# Patient Record
Sex: Male | Born: 1990 | Race: White | Hispanic: No | Marital: Single | Smoking: Current every day smoker
Health system: Southern US, Community
[De-identification: ages and names within clinical notes are randomized; demographics above are authoritative.]

## PROBLEM LIST (undated history)

## (undated) DIAGNOSIS — R011 Cardiac murmur, unspecified: Secondary | ICD-10-CM

## (undated) DIAGNOSIS — N289 Disorder of kidney and ureter, unspecified: Secondary | ICD-10-CM

---

## 2012-07-29 ENCOUNTER — Emergency Department (HOSPITAL_COMMUNITY): Payer: Self-pay

## 2012-07-29 ENCOUNTER — Emergency Department (HOSPITAL_COMMUNITY)
Admission: EM | Admit: 2012-07-29 | Discharge: 2012-07-29 | Disposition: A | Discharge status: Home / Self Care | Payer: Self-pay | Attending: Emergency Medicine | Admitting: Emergency Medicine

## 2012-07-29 ENCOUNTER — Encounter (HOSPITAL_COMMUNITY): Payer: Self-pay | Admitting: Nurse Practitioner

## 2012-07-29 DIAGNOSIS — Y9389 Activity, other specified: Secondary | ICD-10-CM | POA: Insufficient documentation

## 2012-07-29 DIAGNOSIS — R011 Cardiac murmur, unspecified: Secondary | ICD-10-CM | POA: Insufficient documentation

## 2012-07-29 DIAGNOSIS — W108XXA Fall (on) (from) other stairs and steps, initial encounter: Secondary | ICD-10-CM | POA: Insufficient documentation

## 2012-07-29 DIAGNOSIS — Y9289 Other specified places as the place of occurrence of the external cause: Secondary | ICD-10-CM | POA: Insufficient documentation

## 2012-07-29 DIAGNOSIS — R209 Unspecified disturbances of skin sensation: Secondary | ICD-10-CM | POA: Insufficient documentation

## 2012-07-29 DIAGNOSIS — S60229A Contusion of unspecified hand, initial encounter: Secondary | ICD-10-CM | POA: Insufficient documentation

## 2012-07-29 DIAGNOSIS — S60221A Contusion of right hand, initial encounter: Secondary | ICD-10-CM

## 2012-07-29 DIAGNOSIS — S20221A Contusion of right back wall of thorax, initial encounter: Secondary | ICD-10-CM

## 2012-07-29 DIAGNOSIS — S20229A Contusion of unspecified back wall of thorax, initial encounter: Secondary | ICD-10-CM | POA: Insufficient documentation

## 2012-07-29 DIAGNOSIS — W1809XA Striking against other object with subsequent fall, initial encounter: Secondary | ICD-10-CM | POA: Insufficient documentation

## 2012-07-29 DIAGNOSIS — F17200 Nicotine dependence, unspecified, uncomplicated: Secondary | ICD-10-CM | POA: Insufficient documentation

## 2012-07-29 HISTORY — DX: Cardiac murmur, unspecified: R01.1

## 2012-07-29 MED ORDER — HYDROCODONE-ACETAMINOPHEN 5-325 MG PO TABS: 1.0000 | ORAL_TABLET | ORAL | Status: DC | PRN

## 2012-07-29 MED ORDER — HYDROCODONE-ACETAMINOPHEN 5-325 MG PO TABS
1.0000 | ORAL_TABLET | Freq: Once | ORAL | Status: AC
  Administered 2012-07-29: 1 via ORAL
  Filled 2012-07-29: qty 1

## 2012-07-29 MED ORDER — DIAZEPAM 5 MG PO TABS: 5.0000 mg | ORAL_TABLET | Freq: Two times a day (BID) | ORAL | Status: DC

## 2012-07-29 NOTE — ED Notes (Signed)
PA at bedside.

## 2012-07-29 NOTE — ED Provider Notes (Signed)
CSN: [id number]     Arrival date & time 07/29/12  1832 History     First MD Initiated Contact with Patient 07/29/12 2007     Chief Complaint  Patient presents with  . Fall   (Consider location/radiation/quality/duration/timing/severity/associated sxs/prior Treatment) HPI History provided by pt.   Pt was helping a friend move an entertainment stand off of a moving truck 2 days ago, missed the ramp with foot and fell backwards, from ~26ft high, landing on his back.  Dorsal surface of right hand hit the pavement as well.  Did not hit head.  C/o pain in right medial back that is aggravated by coughing and movement, as well as pain in right lateral hand and pinky w/ associated numbness.  Denies neck pain, SOB, extremity weakness, loss of control of bladder/bowels.  Has not taken anything for pain.  No PMH.   Past Medical History  Diagnosis Date  . Heart murmur    History reviewed. No pertinent past surgical history. History reviewed. No pertinent family history. History  Substance Use Topics  . Smoking status: Current Every Day Smoker    Types: Cigarettes  . Smokeless tobacco: Not on file  . Alcohol Use: No    Review of Systems  All other systems reviewed and are negative.    Allergies  Ciprofloxacin and Ibuprofen  Home Medications  No current outpatient prescriptions on file. BP 126/50  Pulse 84  Temp(Src) 98 F (36.7 C) (Oral)  Resp 18  Ht 5\' 11"  (1.803 m)  Wt 160 lb (72.576 kg)  BMI 22.33 kg/m2  SpO2 100% Physical Exam  Nursing note and vitals reviewed. Constitutional: He is oriented to person, place, and time. He appears well-developed and well-nourished. No distress.  HENT:  Head: Normocephalic and atraumatic.  Eyes:  Normal appearance  Neck: Normal range of motion.  Cardiovascular: Normal rate and regular rhythm.   Pulmonary/Chest: Effort normal and breath sounds normal. No respiratory distress.  Pt does not complain of pain w/ deep inspirations   Musculoskeletal: Normal range of motion.  Entire spine non-tender.  Tenderness right thoracic soft tissues only.  No ecchymosis or abrasions.  Right hand w/out deformity, ecchymosis or obvious edema.  Tenderness 4th and 5th metacarpals and diffuse right 5th digit.  Pain w/ passive ROM of 4th and 5th digits.  No pain w/ ROM of wrist.  Decreased sensation to light touch on dorsal surface of 4th and 5th metacarpals.  Brisk cap refill and distal sensation intact.   Neurological: He is alert and oriented to person, place, and time.  Skin: Skin is warm and dry. No rash noted.  Psychiatric: He has a normal mood and affect. His behavior is normal.    ED Course   Procedures (including critical care time)  Labs Reviewed - No data to display Dg Ribs Unilateral W/chest Right  07/29/2012   *RADIOLOGY REPORT*  Clinical Data: Fall.  Pleuritic right-sided chest pain.  RIGHT RIBS AND CHEST - 3+ VIEW  Comparison: None.  Findings: No pneumothorax.  Frontal view the chest appears within normal limits.  There is no pleural fluid.  There are no displaced rib fractures identified.  IMPRESSION: No displaced rib fracture or acute osseous abnormality.   Original Report Authenticated By: Andreas Newport, M.D.   Dg Hand Complete Right  07/29/2012   *RADIOLOGY REPORT*  Clinical Data: Pain secondary to a fall 2 days ago.  RIGHT HAND - COMPLETE 3+ VIEW  Comparison: None.  Findings: There is no fracture or dislocation  or other osseous abnormality.  Soft tissue swelling over the dorsal aspect of the fifth metacarpal phalangeal joint.  IMPRESSION: No acute osseous abnormality.   Original Report Authenticated By: Francene Boyers, M.D.   1. Contusion of back, right, initial encounter   2. Contusion of hand, right, initial encounter     MDM  Healthy 21yo M had a mechanical fall from ~30ft 2 days ago and landed on his back.  Presents w/ right mid-back pain and right lateral hand and pinky pain.  Low suspicion for rib fracture and/or  pneumothorax.  Explained to patient that xray will not change treatment plan and fx may not be evident at this time, but he requests imaging.  Xray of right hand negative.  There is decreased sensation to light touch dorsal surface 4th and 5th metacarpals, likely from mild edema, but I will refer to Hand.  8:50 PM   No obvious rib fx.  No pain w/ deep inspiration so did not order incentive spirometer.  Prescribed 12 vicodin and recommended NSAID, ice and rest.  Return precautions discussed.   Otilio Miu, PA-C 07/29/12 2316

## 2012-07-29 NOTE — ED Notes (Signed)
Pt was moving an entertainment stand 2 days ago, he fell off ramp landing on back and entertainment stand landed on him. C/o mid back and R 5th knuckle pain since. Ambulatory, mae, cms intact

## 2012-07-29 NOTE — ED Notes (Signed)
Patient transported to X-ray 

## 2012-07-29 NOTE — ED Notes (Signed)
Patient returned from X-ray 

## 2012-07-30 NOTE — ED Provider Notes (Signed)
Medical screening examination/treatment/procedure(s) were performed by non-physician practitioner and as supervising physician I was immediately available for consultation/collaboration.  Breianna Delfino, MD 07/30/12 1716 

## 2012-08-05 ENCOUNTER — Encounter (HOSPITAL_COMMUNITY): Payer: Self-pay

## 2012-08-05 ENCOUNTER — Emergency Department (HOSPITAL_COMMUNITY)
Admission: EM | Admit: 2012-08-05 | Discharge: 2012-08-05 | Disposition: A | Discharge status: Home / Self Care | Payer: Self-pay | Attending: Emergency Medicine | Admitting: Emergency Medicine

## 2012-08-05 ENCOUNTER — Emergency Department (HOSPITAL_COMMUNITY): Payer: Self-pay

## 2012-08-05 DIAGNOSIS — W208XXA Other cause of strike by thrown, projected or falling object, initial encounter: Secondary | ICD-10-CM | POA: Insufficient documentation

## 2012-08-05 DIAGNOSIS — Y929 Unspecified place or not applicable: Secondary | ICD-10-CM | POA: Insufficient documentation

## 2012-08-05 DIAGNOSIS — R011 Cardiac murmur, unspecified: Secondary | ICD-10-CM | POA: Insufficient documentation

## 2012-08-05 DIAGNOSIS — Y9389 Activity, other specified: Secondary | ICD-10-CM | POA: Insufficient documentation

## 2012-08-05 DIAGNOSIS — F17200 Nicotine dependence, unspecified, uncomplicated: Secondary | ICD-10-CM | POA: Insufficient documentation

## 2012-08-05 DIAGNOSIS — S60229A Contusion of unspecified hand, initial encounter: Secondary | ICD-10-CM | POA: Insufficient documentation

## 2012-08-05 DIAGNOSIS — T148XXA Other injury of unspecified body region, initial encounter: Secondary | ICD-10-CM

## 2012-08-05 MED ORDER — ACETAMINOPHEN 325 MG PO TABS
975.0000 mg | ORAL_TABLET | Freq: Once | ORAL | Status: AC
  Administered 2012-08-05: 975 mg via ORAL
  Filled 2012-08-05: qty 2

## 2012-08-05 NOTE — ED Notes (Signed)
To x-ray

## 2012-08-05 NOTE — ED Provider Notes (Signed)
CSN: [id number]     Arrival date & time 08/05/12  1638 History  This chart was scribed for non-physician practitioner Arthor Captain, PA-C, working with [name], by Yevette Edwards, ED Scribe. This patient was seen in room TR08C/TR08C and the patient's care was started at 5:18 PM.   First MD Initiated Contact with Patient 08/05/12 1643     Chief Complaint  Patient presents with  . Hand Pain    The history is provided by the patient. No language interpreter was used.   HPI Comments: Jerry Yang is a 22 y.o. male who presents to the Emergency Department complaining of pain to his right hand which occurred today at work. The pt had a bundle of shingles dropped upon his hand today, and he reports that it was to the same area for which he sought treatment at this ED seven days ago. With that visit, the pt was treated and discharged with Vicodin. He reports that his Vicodin has since run out.   Past Medical History  Diagnosis Date  . Heart murmur    History reviewed. No pertinent past surgical history. History reviewed. No pertinent family history. History  Substance Use Topics  . Smoking status: Current Every Day Smoker    Types: Cigarettes  . Smokeless tobacco: Not on file  . Alcohol Use: No    Review of Systems  Constitutional: Negative for fever and chills.  Musculoskeletal: Positive for myalgias (Right hand pain).    Allergies  Ciprofloxacin and Ibuprofen  Home Medications   Current Outpatient Rx  Name  Route  Sig  Dispense  Refill  . HYDROcodone-acetaminophen (NORCO/VICODIN) 5-325 MG per tablet   Oral   Take 1 tablet by mouth every 4 (four) hours as needed for pain.   12 tablet   0    Triage Vitals: BP 130/48  Pulse 88  Temp(Src) 98.3 F (36.8 C) (Oral)  Resp 16  Ht 5\' 11"  (1.803 m)  Wt 160 lb (72.576 kg)  BMI 22.33 kg/m2  SpO2 100%  Physical Exam  Nursing note and vitals reviewed. Constitutional: He is oriented to person, place, and time. He  appears well-developed and well-nourished. No distress.  HENT:  Head: Normocephalic and atraumatic.  Eyes: EOM are normal.  Neck: Neck supple. No tracheal deviation present.  Cardiovascular: Normal rate.   Pulmonary/Chest: Effort normal. No respiratory distress.  Musculoskeletal: Normal range of motion.  Pain out of proportion to exam. No swelling. No bruising. No deformity. He states that he cannot move his fingers. He has exquisite tenderness to palpation of the MCP of the right 4th digit. Pulses intact. Cap refill less than 3 seconds.   Neurological: He is alert and oriented to person, place, and time.  Skin: Skin is warm and dry.  Psychiatric: He has a normal mood and affect. His behavior is normal.    ED Course   DIAGNOSTIC STUDIES: Oxygen Saturation is 100% on room air, normal by my interpretation.    COORDINATION OF CARE:  5:21 PM- Discussed treatment plan with patient which includes imaging, and the patient agreed to the plan.    Procedures (including critical care time)  Labs Reviewed - No data to display Dg Hand Complete Right  08/05/2012   *RADIOLOGY REPORT*  Clinical Data: Right hand injury/pain  RIGHT HAND - COMPLETE 3+ VIEW  Comparison: 07/29/2012  Findings: No fracture or dislocation is seen.  The joint spaces are preserved.  Mild soft tissue swelling overlying the dorsum of the fifth  MCP joint.  IMPRESSION: No fracture or dislocation is seen.  Mild soft tissue swelling.   Original Report Authenticated By: Charline Bills, M.D.   1. Contusion     MDM  BP 130/48  Pulse 88  Temp(Src) 98.3 F (36.8 C) (Oral)  Resp 16  Ht 5\' 11"  (1.803 m)  Wt 160 lb (72.576 kg)  BMI 22.33 kg/m2  SpO2 100% Patient without evidence of fracture. No signs of injury other than subjective pain. Patient may take tylenol at home for his pain and follow up with  Hand if sxs worsen or do not resolve. i do not suspect occult fracture or infection.   I personally performed the services  described in this documentation, which was scribed in my presence. The recorded information has been reviewed and is accurate.     Arthor Captain, PA-C 08/05/12 1947

## 2012-08-05 NOTE — ED Notes (Signed)
Pt c/o Right hand pain starting today after someone dropped a bundle of shingles on pt's Right hand. Pt seen here on the 27th for an injury to that same hand and was sent home with Vicodin but has ran out since then.

## 2012-08-05 NOTE — ED Notes (Signed)
The pt wants something for pain before he leaves

## 2012-08-05 NOTE — ED Notes (Signed)
The pt is c/o pain and sl swelling in his rt little finger.  He reports that he cannot move it without extreme pain.  Maybe swolen

## 2012-08-07 NOTE — ED Provider Notes (Signed)
Medical screening examination/treatment/procedure(s) were performed by non-physician practitioner and as supervising physician I was immediately available for consultation/collaboration.    [name], MD 08/07/12 [phone]

## 2012-10-10 ENCOUNTER — Encounter (HOSPITAL_COMMUNITY): Payer: Self-pay | Admitting: Emergency Medicine

## 2012-10-10 ENCOUNTER — Emergency Department (HOSPITAL_COMMUNITY)
Admission: EM | Admit: 2012-10-10 | Discharge: 2012-10-10 | Discharge status: Against Medical Advice | Payer: Self-pay | Attending: Emergency Medicine | Admitting: Emergency Medicine

## 2012-10-10 DIAGNOSIS — R110 Nausea: Secondary | ICD-10-CM | POA: Insufficient documentation

## 2012-10-10 DIAGNOSIS — Z881 Allergy status to other antibiotic agents status: Secondary | ICD-10-CM | POA: Insufficient documentation

## 2012-10-10 DIAGNOSIS — Z888 Allergy status to other drugs, medicaments and biological substances status: Secondary | ICD-10-CM | POA: Insufficient documentation

## 2012-10-10 DIAGNOSIS — R011 Cardiac murmur, unspecified: Secondary | ICD-10-CM | POA: Insufficient documentation

## 2012-10-10 DIAGNOSIS — N23 Unspecified renal colic: Secondary | ICD-10-CM

## 2012-10-10 DIAGNOSIS — R319 Hematuria, unspecified: Secondary | ICD-10-CM

## 2012-10-10 DIAGNOSIS — F17200 Nicotine dependence, unspecified, uncomplicated: Secondary | ICD-10-CM | POA: Insufficient documentation

## 2012-10-10 DIAGNOSIS — N390 Urinary tract infection, site not specified: Secondary | ICD-10-CM

## 2012-10-10 LAB — CBC WITH DIFFERENTIAL/PLATELET
Basophils Absolute: 0 10*3/uL (ref 0.0–0.1)
Basophils Relative: 0 % (ref 0–1)
Eosinophils Absolute: 0.1 10*3/uL (ref 0.0–0.7)
HCT: 41.8 % (ref 39.0–52.0)
Hemoglobin: 15.1 g/dL (ref 13.0–17.0)
MCH: 30.6 pg (ref 26.0–34.0)
MCHC: 36.1 g/dL — ABNORMAL HIGH (ref 30.0–36.0)
Monocytes Absolute: 0.8 10*3/uL (ref 0.1–1.0)
Monocytes Relative: 8 % (ref 3–12)
Neutro Abs: 6.5 10*3/uL (ref 1.7–7.7)
RDW: 12.8 % (ref 11.5–15.5)

## 2012-10-10 LAB — COMPREHENSIVE METABOLIC PANEL
AST: 21 U/L (ref 0–37)
Albumin: 5.2 g/dL (ref 3.5–5.2)
BUN: 13 mg/dL (ref 6–23)
Calcium: 10 mg/dL (ref 8.4–10.5)
Chloride: 101 mEq/L (ref 96–112)
Creatinine, Ser: 1.06 mg/dL (ref 0.50–1.35)
Total Protein: 7.5 g/dL (ref 6.0–8.3)

## 2012-10-10 LAB — URINALYSIS, ROUTINE W REFLEX MICROSCOPIC
Glucose, UA: NEGATIVE mg/dL
Ketones, ur: 40 mg/dL — AB
Nitrite: NEGATIVE
Specific Gravity, Urine: 1.024 (ref 1.005–1.030)
pH: 6.5 (ref 5.0–8.0)

## 2012-10-10 LAB — LIPASE, BLOOD: Lipase: 21 U/L (ref 11–59)

## 2012-10-10 LAB — URINE MICROSCOPIC-ADD ON

## 2012-10-10 MED ORDER — CEPHALEXIN 500 MG PO CAPS: 500.0000 mg | ORAL_CAPSULE | Freq: Four times a day (QID) | ORAL | Status: AC

## 2012-10-10 MED ORDER — ONDANSETRON HCL 4 MG PO TABS: 4.0000 mg | ORAL_TABLET | Freq: Four times a day (QID) | ORAL | Status: AC

## 2012-10-10 MED ORDER — ONDANSETRON HCL 4 MG/2ML IJ SOLN
4.0000 mg | Freq: Once | INTRAMUSCULAR | Status: DC
  Filled 2012-10-10: qty 2

## 2012-10-10 MED ORDER — HYDROCODONE-ACETAMINOPHEN 5-325 MG PO TABS: 1.0000 | ORAL_TABLET | ORAL | Status: AC | PRN

## 2012-10-10 MED ORDER — MORPHINE SULFATE 4 MG/ML IJ SOLN
4.0000 mg | Freq: Once | INTRAMUSCULAR | Status: DC
  Filled 2012-10-10: qty 1

## 2012-10-10 MED ORDER — TAMSULOSIN HCL 0.4 MG PO CAPS: 0.4000 mg | ORAL_CAPSULE | Freq: Every day | ORAL | Status: AC

## 2012-10-10 NOTE — ED Notes (Signed)
Pt. reports LLQ pain radiating down to his left testicle onset yesterday with dizziness. Denies nausea /vomitting or diarrhea.

## 2012-10-10 NOTE — ED Provider Notes (Signed)
CSN: [id number]     Arrival date & time 10/10/12  4098 History   First MD Initiated Contact with Patient 10/10/12 0522     Chief Complaint  Patient presents with  . Abdominal Pain   (Consider location/radiation/quality/duration/timing/severity/associated sxs/prior Treatment) HPI This patient is a generally healthy young man who presents with 2 days of left flank pain which radiates to the left inguinal region. His pain has been pretty much constant. However, it has worsened significantly over the last 12 hours. Patient describes his pain as severe and aching. It waxes and wanes in severity. He has not appreciated any exacerbating or relieving factors.  He denies dysuria and gross hematuria. He has been nauseated but denies vomiting. No fever. No history of similar symptoms and a history of abdominal surgeries.  Past Medical History  Diagnosis Date  . Heart murmur    History reviewed. No pertinent past surgical history. No family history on file. History  Substance Use Topics  . Smoking status: Current Every Day Smoker    Types: Cigarettes  . Smokeless tobacco: Not on file  . Alcohol Use: No    Review of Systems Gen: no weight loss, fevers, chills, night sweats Eyes: no discharge or drainage, no occular pain or visual changes Nose: no epistaxis or rhinorrhea Mouth: no dental pain, no sore throat Neck: no neck pain Lungs: no SOB, cough, wheezing CV: no chest pain, palpitations, dependent edema or orthopnea Abd: as per hpi, otherwise negative GU: no dysuria or gross hematuria MSK: no myalgias or arthralgias Neuro: no headache, no focal neurologic deficits Skin: no rash Psyche: negative.  Allergies  Ciprofloxacin and Ibuprofen  Home Medications  No current outpatient prescriptions on file. BP 132/83  Pulse 79  Temp(Src) 97.4 F (36.3 C) (Oral)  Resp 18  SpO2 100% Physical Exam Gen: well developed and well nourished appearing, appears mildly uncomfortable and  anxious Head: NCAT Eyes: PERL, EOMI Nose: no epistaixis or rhinorrhea Mouth/throat: mucosa is moist and pink Neck: supple, no stridor Lungs: CTA B, no wheezing, rhonchi or rales Abd: soft, mildly tender over the LUQ, nondistended GU: normal appearing circumsized penis without lesions, normal appearing testicles and scrotum, testicle are non-tender and without dominant masses.  Back: no ttp, no cva ttp Skin: warm and dry Neuro: CN ii-xii grossly intact, no focal deficits Psyche; normal affect,  calm and cooperative.   ED Course  Procedures (including critical care time) Labs Review  Results for orders placed during the hospital encounter of 10/10/12 (from the past 24 hour(s))  CBC WITH DIFFERENTIAL     Status: Abnormal   Collection Time    10/10/12  3:02 AM      Result Value Range   WBC 10.7 (*) 4.0 - 10.5 K/uL   RBC 4.93  4.22 - 5.81 MIL/uL   Hemoglobin 15.1  13.0 - 17.0 g/dL   HCT 11.9  14.7 - 82.9 %   MCV 84.8  78.0 - 100.0 fL   MCH 30.6  26.0 - 34.0 pg   MCHC 36.1 (*) 30.0 - 36.0 g/dL   RDW 56.2  13.0 - 86.5 %   Platelets 169  150 - 400 K/uL   Neutrophils Relative % 61  43 - 77 %   Neutro Abs 6.5  1.7 - 7.7 K/uL   Lymphocytes Relative 30  12 - 46 %   Lymphs Abs 3.2  0.7 - 4.0 K/uL   Monocytes Relative 8  3 - 12 %   Monocytes Absolute 0.8  0.1 - 1.0 K/uL   Eosinophils Relative 1  0 - 5 %   Eosinophils Absolute 0.1  0.0 - 0.7 K/uL   Basophils Relative 0  0 - 1 %   Basophils Absolute 0.0  0.0 - 0.1 K/uL  COMPREHENSIVE METABOLIC PANEL     Status: Abnormal   Collection Time    10/10/12  3:02 AM      Result Value Range   Sodium 140  135 - 145 mEq/L   Potassium 4.4  3.5 - 5.1 mEq/L   Chloride 101  96 - 112 mEq/L   CO2 26  19 - 32 mEq/L   Glucose, Bld 123 (*) 70 - 99 mg/dL   BUN 13  6 - 23 mg/dL   Creatinine, Ser 1.61  0.50 - 1.35 mg/dL   Calcium 09.6  8.4 - 04.5 mg/dL   Total Protein 7.5  6.0 - 8.3 g/dL   Albumin 5.2  3.5 - 5.2 g/dL   AST 21  0 - 37 U/L   ALT 14   0 - 53 U/L   Alkaline Phosphatase 64  39 - 117 U/L   Total Bilirubin 0.7  0.3 - 1.2 mg/dL   GFR calc non Af Amer >90  >90 mL/min   GFR calc Af Amer >90  >90 mL/min  LIPASE, BLOOD     Status: None   Collection Time    10/10/12  3:02 AM      Result Value Range   Lipase 21  11 - 59 U/L  URINALYSIS, ROUTINE W REFLEX MICROSCOPIC     Status: Abnormal   Collection Time    10/10/12  5:30 AM      Result Value Range   Color, Urine AMBER (*) YELLOW   APPearance CLOUDY (*) CLEAR   Specific Gravity, Urine 1.024  1.005 - 1.030   pH 6.5  5.0 - 8.0   Glucose, UA NEGATIVE  NEGATIVE mg/dL   Hgb urine dipstick LARGE (*) NEGATIVE   Bilirubin Urine SMALL (*) NEGATIVE   Ketones, ur 40 (*) NEGATIVE mg/dL   Protein, ur 409 (*) NEGATIVE mg/dL   Urobilinogen, UA 1.0  0.0 - 1.0 mg/dL   Nitrite NEGATIVE  NEGATIVE   Leukocytes, UA SMALL (*) NEGATIVE  URINE MICROSCOPIC-ADD ON     Status: Abnormal   Collection Time    10/10/12  5:30 AM      Result Value Range   Squamous Epithelial / LPF RARE  RARE   WBC, UA 3-6  <3 WBC/hpf   RBC / HPF TOO NUMEROUS TO COUNT  <3 RBC/hpf   Bacteria, UA FEW (*) RARE     MDM  DDx: uti, renal colic, gastritis, PUD, colitis, IBS, IBD, functional abdominal pain, pancreatitis  We are managing supportively. I have discussed lab findings and clinical suspicion for ureteral stone with the patient as well as recommendation for CT abd/pelvis to confirm. Patient is also noted to have a few bacteria and WBC per HPF on U/A.   The patient says that he absolutely must leave by 0730h because he has a lot of things to do today. Unfortunately, we are unable to expedite the patient's CT scan and interpretation to meet his timeline. Thus, he will sign out AMA.  I have counseled the patient to return promptly to the ED for fever or worsening sx. We will tx empirically with Keflex and send urine for Cx. Will also tx with short course of Flomax, Vicodin and Zofran. Patient will be  referred to  Urology for outpatient f/u.   AMA conversation dicussed in the presence of the patient's primary nurse.     Brandt Loosen, MD 10/10/12 [phone]

## 2012-10-10 NOTE — ED Notes (Signed)
Pt. Reports "having to be home at 0730 in order to go to work." Pt. Advised about the time length for CT scan.  Dr. Lavella Lemons notified and will speak with pt.

## 2012-11-03 ENCOUNTER — Emergency Department (HOSPITAL_COMMUNITY): Payer: Medicaid Other

## 2012-11-03 ENCOUNTER — Encounter (HOSPITAL_COMMUNITY): Payer: Self-pay | Admitting: Emergency Medicine

## 2012-11-03 ENCOUNTER — Emergency Department (HOSPITAL_COMMUNITY)
Admission: EM | Admit: 2012-11-03 | Discharge: 2012-11-03 | Disposition: A | Discharge status: Home / Self Care | Payer: Medicaid Other | Attending: Emergency Medicine | Admitting: Emergency Medicine

## 2012-11-03 DIAGNOSIS — F17200 Nicotine dependence, unspecified, uncomplicated: Secondary | ICD-10-CM | POA: Insufficient documentation

## 2012-11-03 DIAGNOSIS — R011 Cardiac murmur, unspecified: Secondary | ICD-10-CM | POA: Insufficient documentation

## 2012-11-03 DIAGNOSIS — Z792 Long term (current) use of antibiotics: Secondary | ICD-10-CM | POA: Insufficient documentation

## 2012-11-03 DIAGNOSIS — Z87448 Personal history of other diseases of urinary system: Secondary | ICD-10-CM | POA: Insufficient documentation

## 2012-11-03 DIAGNOSIS — R109 Unspecified abdominal pain: Secondary | ICD-10-CM | POA: Insufficient documentation

## 2012-11-03 DIAGNOSIS — R3915 Urgency of urination: Secondary | ICD-10-CM | POA: Insufficient documentation

## 2012-11-03 DIAGNOSIS — R350 Frequency of micturition: Secondary | ICD-10-CM | POA: Insufficient documentation

## 2012-11-03 DIAGNOSIS — M549 Dorsalgia, unspecified: Secondary | ICD-10-CM | POA: Insufficient documentation

## 2012-11-03 DIAGNOSIS — R300 Dysuria: Secondary | ICD-10-CM | POA: Insufficient documentation

## 2012-11-03 DIAGNOSIS — Z79899 Other long term (current) drug therapy: Secondary | ICD-10-CM | POA: Insufficient documentation

## 2012-11-03 HISTORY — DX: Disorder of kidney and ureter, unspecified: N28.9

## 2012-11-03 LAB — CBC
Hemoglobin: 15.4 g/dL (ref 13.0–17.0)
MCH: 31.6 pg (ref 26.0–34.0)
MCHC: 36.1 g/dL — ABNORMAL HIGH (ref 30.0–36.0)
MCV: 87.5 fL (ref 78.0–100.0)
Platelets: 168 10*3/uL (ref 150–400)
RBC: 4.88 MIL/uL (ref 4.22–5.81)

## 2012-11-03 LAB — BASIC METABOLIC PANEL
CO2: 28 mEq/L (ref 19–32)
Calcium: 9.5 mg/dL (ref 8.4–10.5)
Creatinine, Ser: 0.85 mg/dL (ref 0.50–1.35)
GFR calc non Af Amer: >90 mL/min (ref >90)
Glucose, Bld: 90 mg/dL (ref 70–99)

## 2012-11-03 LAB — URINALYSIS, ROUTINE W REFLEX MICROSCOPIC
Bilirubin Urine: NEGATIVE
Hgb urine dipstick: NEGATIVE
Ketones, ur: NEGATIVE mg/dL
Specific Gravity, Urine: 1.027 (ref 1.005–1.030)
Urobilinogen, UA: 1 mg/dL (ref 0.0–1.0)

## 2012-11-03 MED ORDER — HYDROCODONE-ACETAMINOPHEN 5-325 MG PO TABS: 2.0000 | ORAL_TABLET | Freq: Four times a day (QID) | ORAL | Status: AC | PRN

## 2012-11-03 MED ORDER — DIPHENHYDRAMINE HCL 50 MG/ML IJ SOLN
25.0000 mg | Freq: Once | INTRAMUSCULAR | Status: DC
  Filled 2012-11-03: qty 1

## 2012-11-03 MED ORDER — KETOROLAC TROMETHAMINE 30 MG/ML IJ SOLN
30.0000 mg | Freq: Once | INTRAMUSCULAR | Status: AC
  Administered 2012-11-03: 30 mg via INTRAVENOUS
  Filled 2012-11-03: qty 1

## 2012-11-03 NOTE — ED Provider Notes (Signed)
Medical screening examination/treatment/procedure(s) were performed by non-physician practitioner and as supervising physician I was immediately available for consultation/collaboration.   Dione Booze, MD 11/03/12 2300

## 2012-11-03 NOTE — ED Provider Notes (Signed)
Medical screening examination/treatment/procedure(s) were performed by non-physician practitioner and as supervising physician I was immediately available for consultation/collaboration.    Dione Booze, MD 11/03/12 [phone]

## 2012-11-03 NOTE — ED Provider Notes (Signed)
CSN: [id number]     Arrival date & time 11/03/12  0104 History   First MD Initiated Contact with Patient 11/03/12 [phone]     Chief Complaint  Patient presents with  . Hematuria   (Consider location/radiation/quality/duration/timing/severity/associated sxs/prior Treatment) HPI Comments: Patient is a 22 yo M presenting to the ED for severe left sided constant stabbing abdominal pain that began in the left flank and is radiating down to LLQ. Patient states this pain began one day ago and has had associated frequency, hematuria, urgency. Patient denies taking any OTC medication PTA. He denies any aggravating factors. He states this feels like previous kidney stones, his last diagnoses kidney stone was one month ago, per patient. Patient denies any fevers, vomiting, diarrhea.  Patient is a 22 y.o. male presenting with hematuria.  Hematuria Associated symptoms include abdominal pain. Pertinent negatives include no fever, nausea or vomiting.    Past Medical History  Diagnosis Date  . Heart murmur   . Renal disorder    History reviewed. No pertinent past surgical history. No family history on file. History  Substance Use Topics  . Smoking status: Current Every Day Smoker    Types: Cigarettes  . Smokeless tobacco: Not on file  . Alcohol Use: No    Review of Systems  Constitutional: Negative for fever.  Gastrointestinal: Positive for abdominal pain. Negative for nausea, vomiting and diarrhea.  Genitourinary: Positive for dysuria, urgency, frequency and hematuria. Negative for discharge, penile swelling, scrotal swelling, enuresis, penile pain and testicular pain.  Musculoskeletal: Positive for back pain.  All other systems reviewed and are negative.    Allergies  Ciprofloxacin and Ibuprofen  Home Medications   Current Outpatient Rx  Name  Route  Sig  Dispense  Refill  . cephALEXin (KEFLEX) 500 MG capsule   Oral   Take 1 capsule (500 mg total) by mouth 4 (four) times daily.   28  capsule   0   . HYDROcodone-acetaminophen (NORCO/VICODIN) 5-325 MG per tablet   Oral   Take 1-2 tablets by mouth every 4 (four) hours as needed for pain.   10 tablet   0   . ondansetron (ZOFRAN) 4 MG tablet   Oral   Take 1 tablet (4 mg total) by mouth every 6 (six) hours.   12 tablet   0   . tamsulosin (FLOMAX) 0.4 MG CAPS capsule   Oral   Take 1 capsule (0.4 mg total) by mouth daily after supper.   10 capsule   0    BP 114/70  Pulse 72  Temp(Src) 97.2 F (36.2 C) (Oral)  Resp 16  Wt 154 lb (69.854 kg)  BMI 21.49 kg/m2  SpO2 100% Physical Exam  Constitutional: He is oriented to person, place, and time. He appears well-developed and well-nourished. No distress.  HENT:  Head: Normocephalic and atraumatic.  Right Ear: External ear normal.  Left Ear: External ear normal.  Nose: Nose normal.  Mouth/Throat: Oropharynx is clear and moist.  Eyes: Conjunctivae are normal.  Neck: Normal range of motion. Neck supple.  Cardiovascular: Normal rate, regular rhythm and normal heart sounds.   Pulmonary/Chest: Effort normal and breath sounds normal. No respiratory distress.  Abdominal: Soft. Bowel sounds are normal. He exhibits no distension. There is tenderness (Minimally tender). There is CVA tenderness. There is no rigidity, no rebound and no guarding.    Musculoskeletal: Normal range of motion.  Neurological: He is alert and oriented to person, place, and time.  Skin: Skin is warm and  dry. He is not diaphoretic.    ED Course  Procedures (including critical care time) Medications  diphenhydrAMINE (BENADRYL) injection 25 mg (not administered)  ketorolac (TORADOL) 30 MG/ML injection 30 mg (30 mg Intravenous Given 11/03/12 0510)    Labs Review Labs Reviewed  URINALYSIS, ROUTINE W REFLEX MICROSCOPIC - Abnormal; Notable for the following:    APPearance CLOUDY (*)    All other components within normal limits  CBC - Abnormal; Notable for the following:    MCHC 36.1 (*)     All other components within normal limits  BASIC METABOLIC PANEL   Imaging Review No results found.  EKG Interpretation   None       MDM   1. Abdominal  pain, other specified site     Afebrile, NAD, non-toxic appearing, AAOx4. I have reviewed nursing notes, vital signs, and all appropriate lab results for this patient. Abdomen is benign. Soft/non-tender/non-distended. No CVA tenderness. Patient is persistent that he has recurrent kidney stone will obtain renal ultrasound. Patient care will be transferred to Roxy Horseman, PA-C. Patient will likely be discharged home with symptomatic management, pending [place] results. Stable at time of shift change.       Jeannetta Ellis, PA-C 11/03/12 1610

## 2012-11-03 NOTE — ED Notes (Signed)
Pt. reports hematuria with left abdominal pain onset this evening , pt. stated history of kidney stone.

## 2012-11-03 NOTE — ED Provider Notes (Signed)
Patient signed out to me by Piepenbrink, PA-C.  Patient with flank pain and prior kidney stone.    Plan:  Renal US.  Re-evaluate.  Results for orders placed during the hospital encounter of 11/03/12  URINALYSIS, ROUTINE W REFLEX MICROSCOPIC      Result Value Range   Color, Urine YELLOW  YELLOW   APPearance CLOUDY (*) CLEAR   Specific Gravity, Urine 1.027  1.005 - 1.030   pH 6.5  5.0 - 8.0   Glucose, UA NEGATIVE  NEGATIVE mg/dL   Hgb urine dipstick NEGATIVE  NEGATIVE   Bilirubin Urine NEGATIVE  NEGATIVE   Ketones, ur NEGATIVE  NEGATIVE mg/dL   Protein, ur NEGATIVE  NEGATIVE mg/dL   Urobilinogen, UA 1.0  0.0 - 1.0 mg/dL   Nitrite NEGATIVE  NEGATIVE   Leukocytes, UA NEGATIVE  NEGATIVE  CBC      Result Value Range   WBC 8.5  4.0 - 10.5 K/uL   RBC 4.88  4.22 - 5.81 MIL/uL   Hemoglobin 15.4  13.0 - 17.0 g/dL   HCT 16.1  09.6 - 04.5 %   MCV 87.5  78.0 - 100.0 fL   MCH 31.6  26.0 - 34.0 pg   MCHC 36.1 (*) 30.0 - 36.0 g/dL   RDW 40.9  81.1 - 91.4 %   Platelets 168  150 - 400 K/uL  BASIC METABOLIC PANEL      Result Value Range   Sodium 141  135 - 145 mEq/L   Potassium 3.6  3.5 - 5.1 mEq/L   Chloride 104  96 - 112 mEq/L   CO2 28  19 - 32 mEq/L   Glucose, Bld 90  70 - 99 mg/dL   BUN 11  6 - 23 mg/dL   Creatinine, Ser 7.82  0.50 - 1.35 mg/dL   Calcium 9.5  8.4 - 95.6 mg/dL   GFR calc non Af Amer >90  >90 mL/min   GFR calc Af Amer >90  >90 mL/min   US Renal  11/03/2012   CLINICAL DATA:  Hematuria  EXAM: RENAL/URINARY TRACT ULTRASOUND COMPLETE  COMPARISON:  None.  FINDINGS: Right Kidney  Length: 10.5 cm. Echogenicity within normal limits. No mass or hydronephrosis visualized.  Left Kidney  Length: 10.5cm. Echogenicity within normal limits. No mass or hydronephrosis visualized.  Bladder  There is a moderate amount of debris dependently within the bladder. The bladder is not particularly distended to suggest secondary obstruction. Bilateral ureteral jets are noted. No definitive wall  thickening.  IMPRESSION: Debris/blood clot within the urinary bladder. No definitive source seen. No urinary obstruction.   Electronically Signed   By: Tiburcio Pea M.D.   On: 11/03/2012 06:32   6:50 AM  PE: Gen: A&O x4 HEENT: PERRL, EOM intact CHEST: RRR, no m/r/g LUNGS: CTAB, no w/r/r ABD: BS x 4, ND/NT, no focal tenderness EXT: No edema, strong peripheral pulses NEURO: Sensation and strength intact bilaterally  Abdomen remains benign. There is some blood clot/debris in the urinary bladder. Recommend urology followup. Will discharge with pain medicine. Patient had a kidney stone approximately one month ago. States that he had a CT scan, and that they found small stones, which he says that he passed. I discussed the patient's workup today with patient, and told him that we do not see any kidney stone. Told him about the blood in his bladder, and that he needs to followup with urology. Patient understands and agrees with this plan.  Patient also discussed with Dr. Preston Fleeting,  who agrees with urology followup. Patient is not in any apparent distress. He is stable and ready for discharge. Return precautions are given.     Roxy Horseman, PA-C 11/03/12 [phone]

## 2012-11-03 NOTE — ED Notes (Signed)
Gave pt. Fluids(Sprite)

## 2012-11-03 NOTE — ED Notes (Signed)
Pt states that he noticed blood in his urine starting yesterday.  Pt states the pain feels similar to the kidney stone he had about one month prior but was able to pass on his own.  Pt states the pain is starting in his left pelvis and shoots into his left testicle.

## 2013-04-09 ENCOUNTER — Emergency Department (HOSPITAL_COMMUNITY)
Admission: EM | Admit: 2013-04-09 | Discharge: 2013-04-09 | Disposition: A | Discharge status: Home / Self Care | Payer: Medicaid Other | Attending: Emergency Medicine | Admitting: Emergency Medicine

## 2013-04-09 ENCOUNTER — Encounter (HOSPITAL_COMMUNITY): Payer: Self-pay | Admitting: Emergency Medicine

## 2013-04-09 DIAGNOSIS — R109 Unspecified abdominal pain: Secondary | ICD-10-CM | POA: Insufficient documentation

## 2013-04-09 NOTE — ED Notes (Signed)
Pt. reports bilateral flank pain with hematuria onset 2 days ago , pt. stated history of kidney stone.

## 2015-07-19 IMAGING — CR DG HAND COMPLETE 3+V*R*
3 series · 3 of 3 positions shown · non-contrast
Comparison: 07/29/2012

CLINICAL DATA: Right hand injury/pain

RIGHT HAND - COMPLETE 3+ VIEW

[x hand pa right]
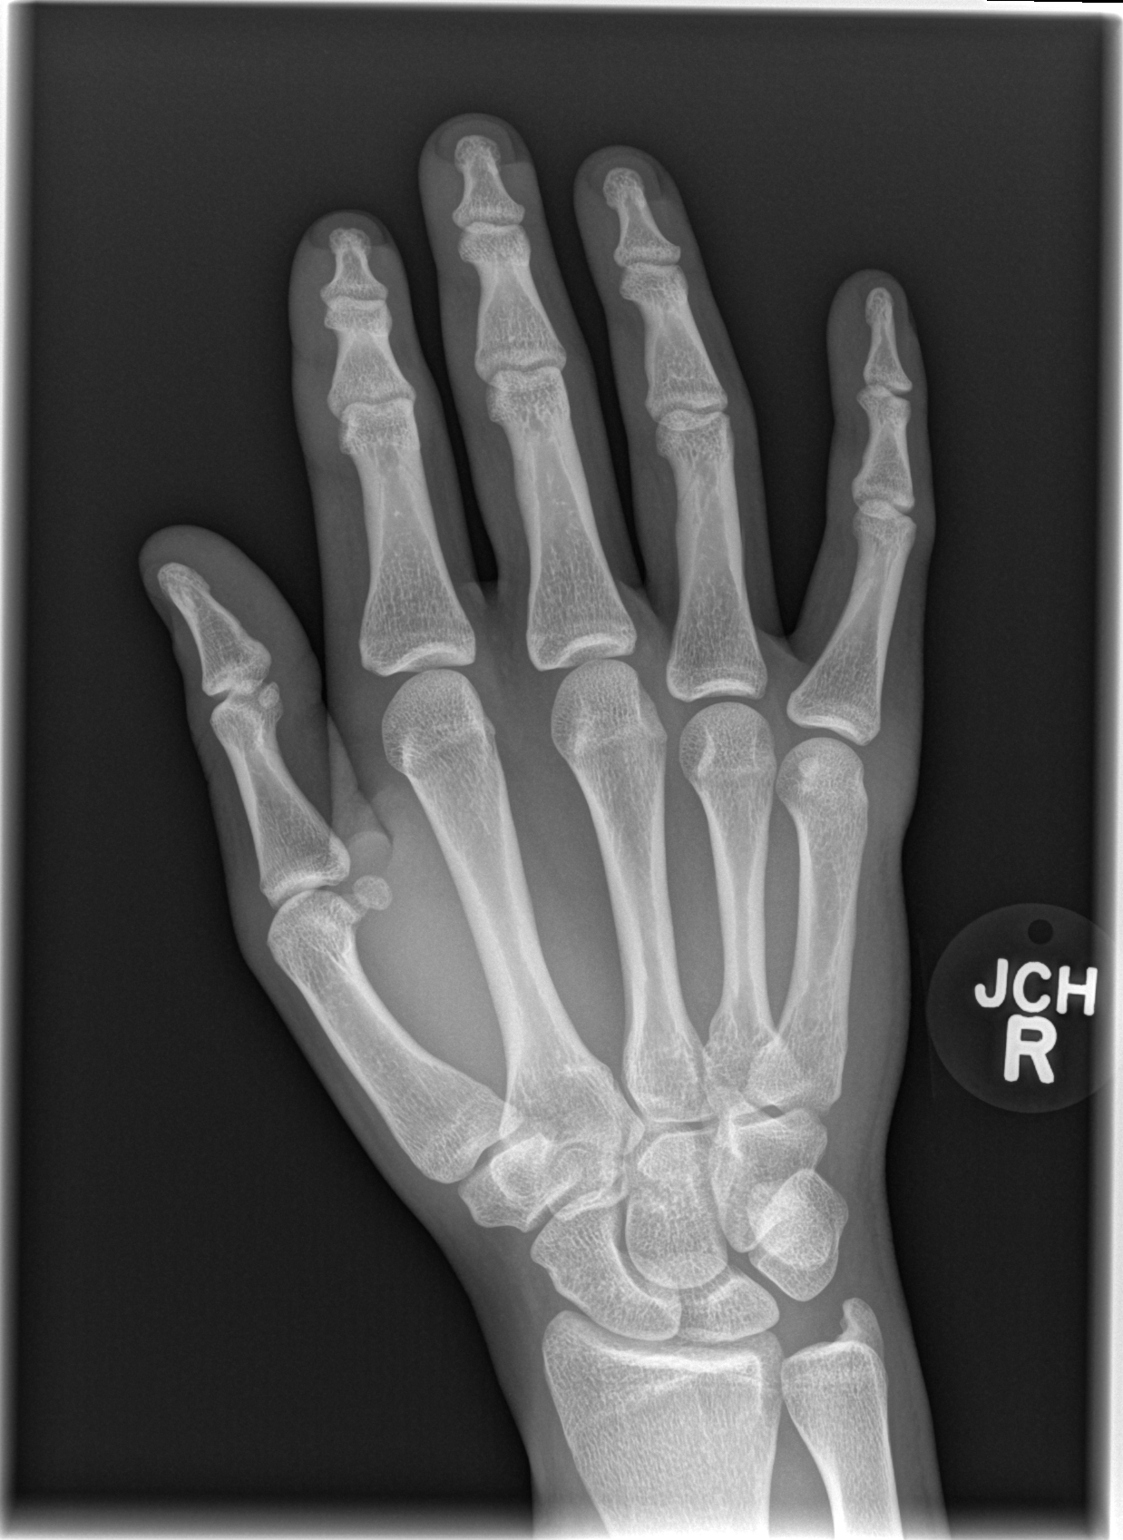

[x hand oblique right]
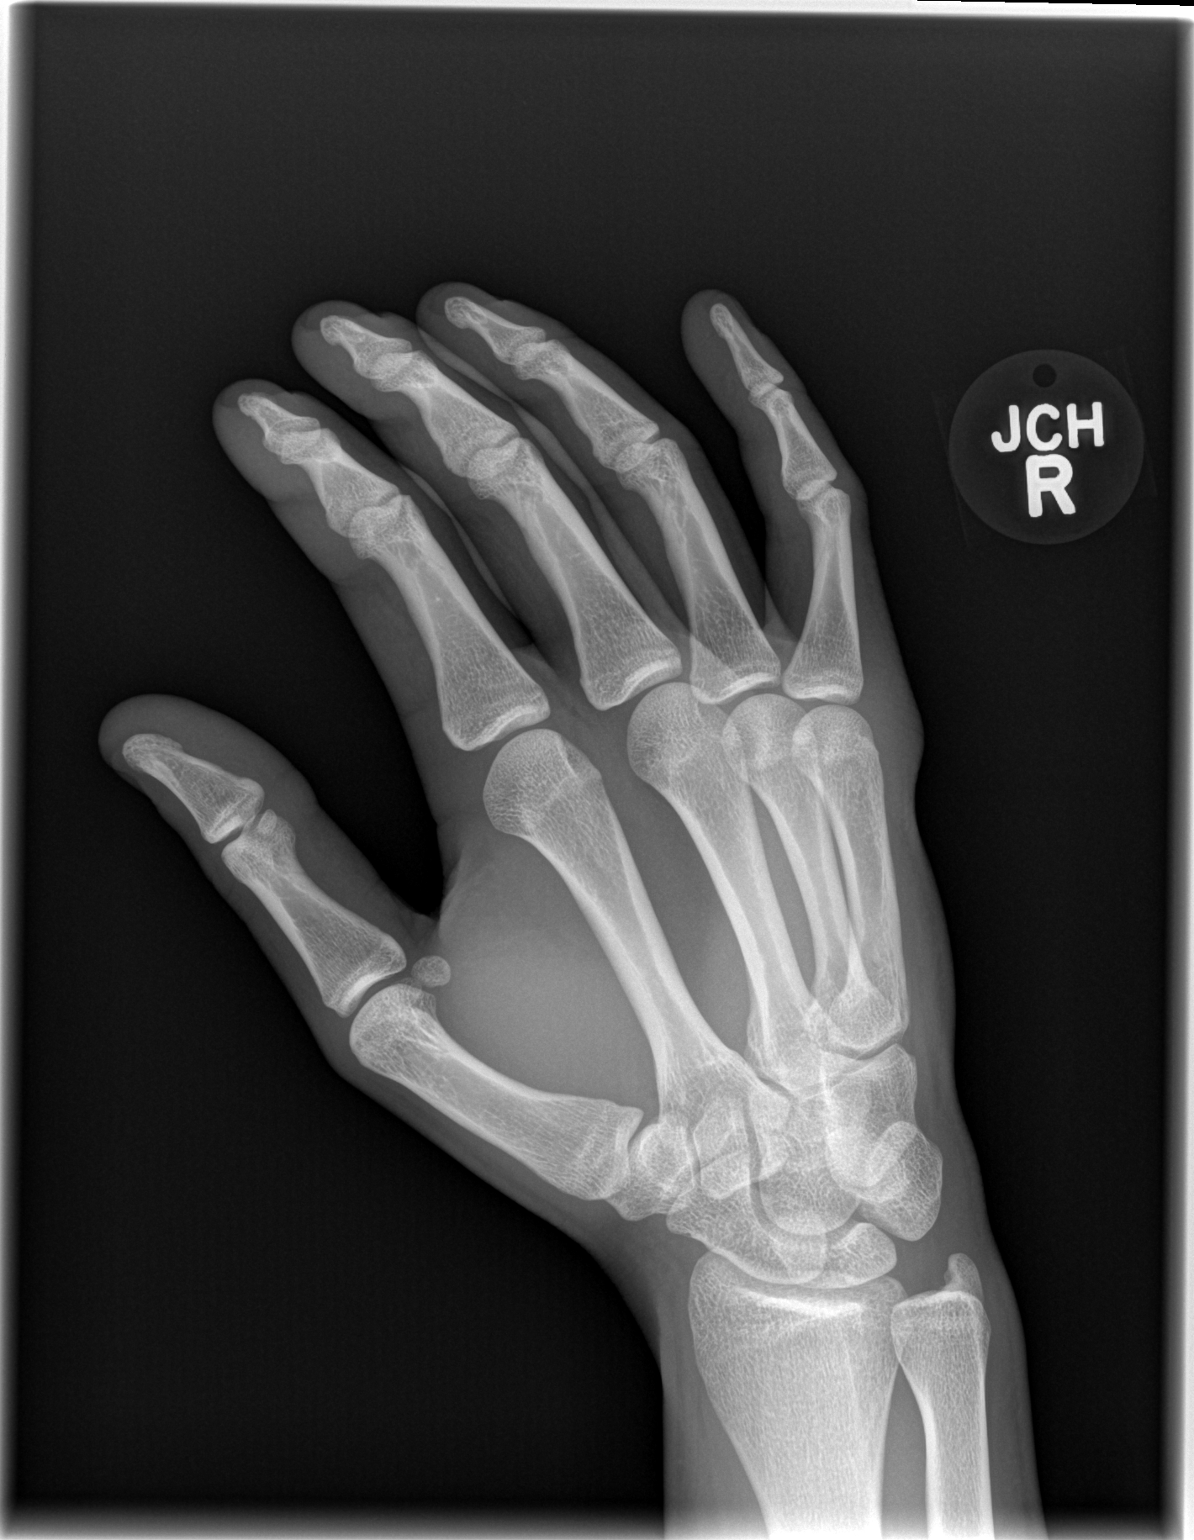

[x hand lat right]
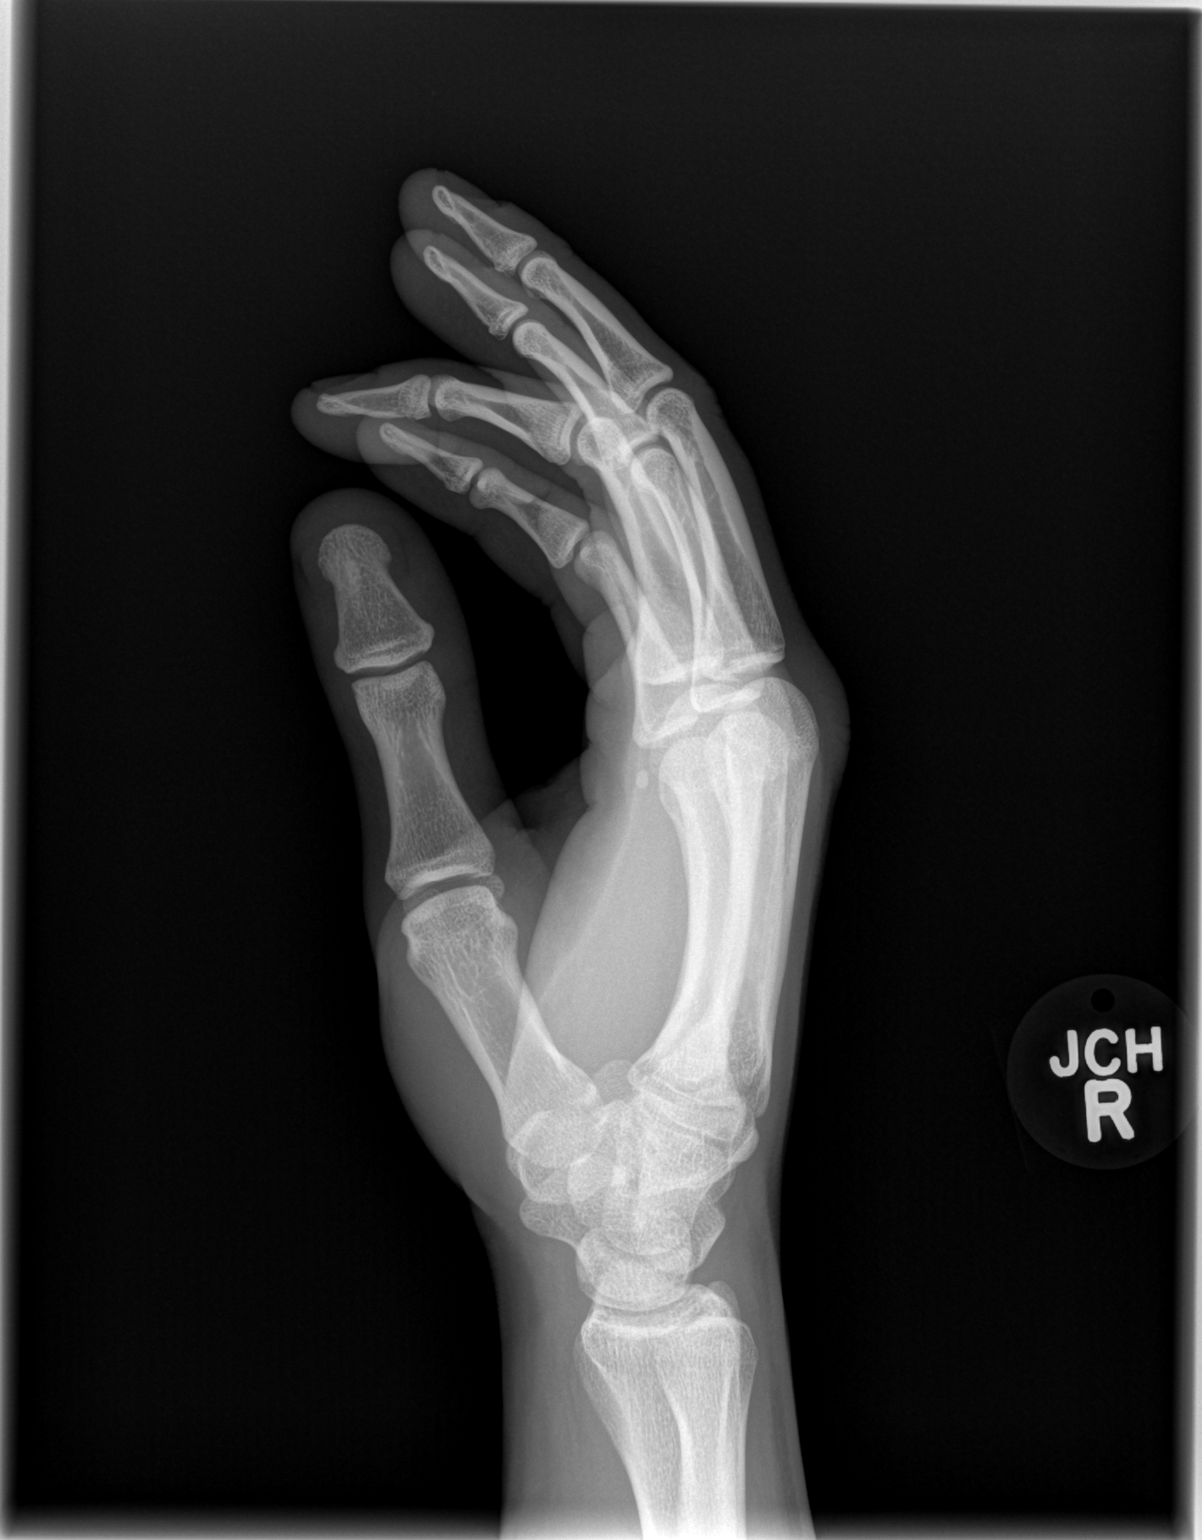

[3 of 3 positions shown; findings below may reference images not displayed]

FINDINGS: No fracture or dislocation is seen.

The joint spaces are preserved.

Mild soft tissue swelling overlying the dorsum of the fifth MCP
joint.
IMPRESSION: No fracture or dislocation is seen.

Mild soft tissue swelling.

## 2015-10-17 IMAGING — US US RENAL
1 series · 14 of 20 positions shown · non-contrast
Comparison: None.

CLINICAL DATA: Hematuria

EXAM:
RENAL/URINARY TRACT ULTRASOUND COMPLETE

[Series 1: us renal · 0.23mm/px · 14 of 20 slices shown]
[im 1/20]
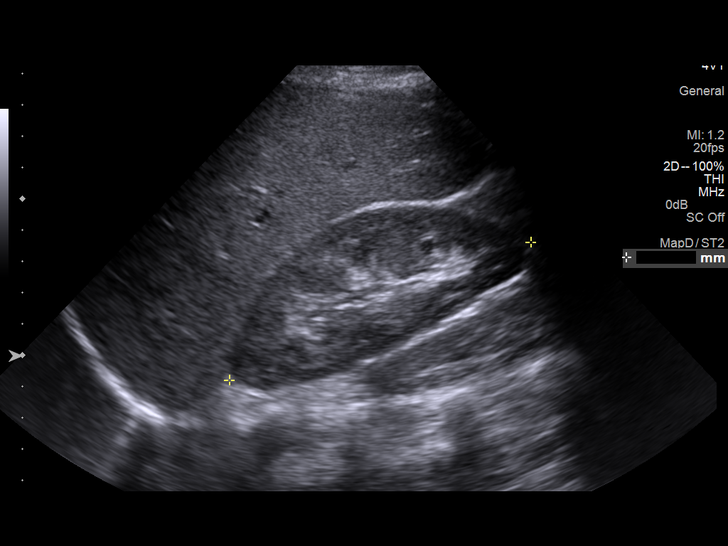
[im 3/20]
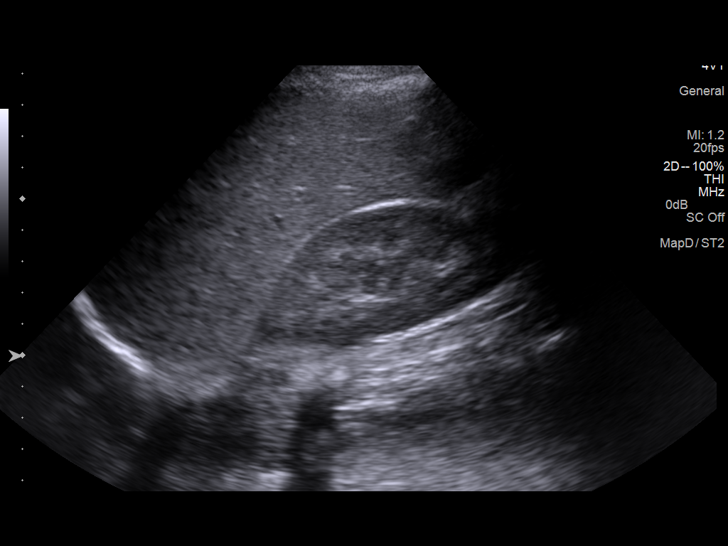
[im 4/20]
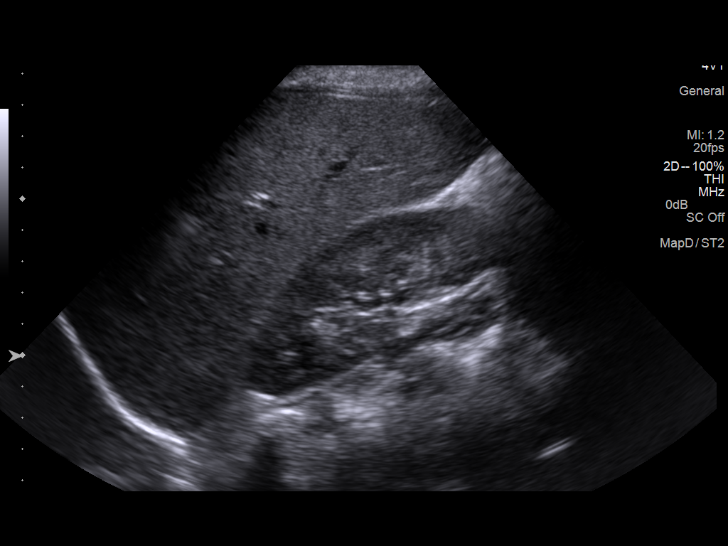
[im 6/20]
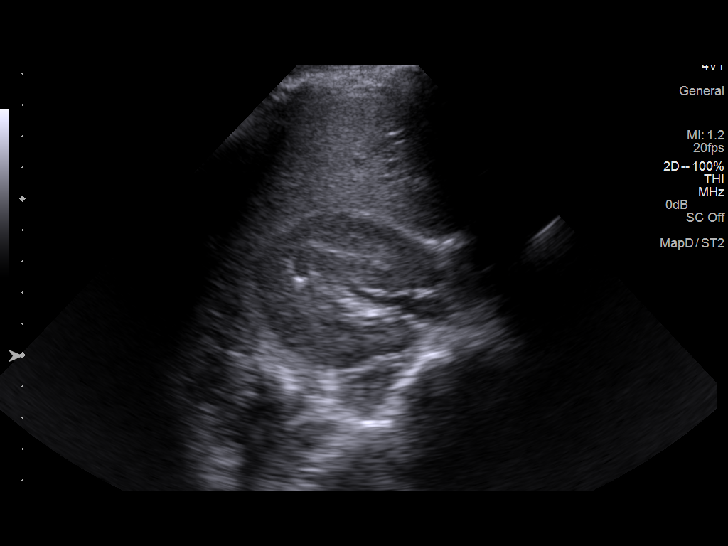
[im 7/20]
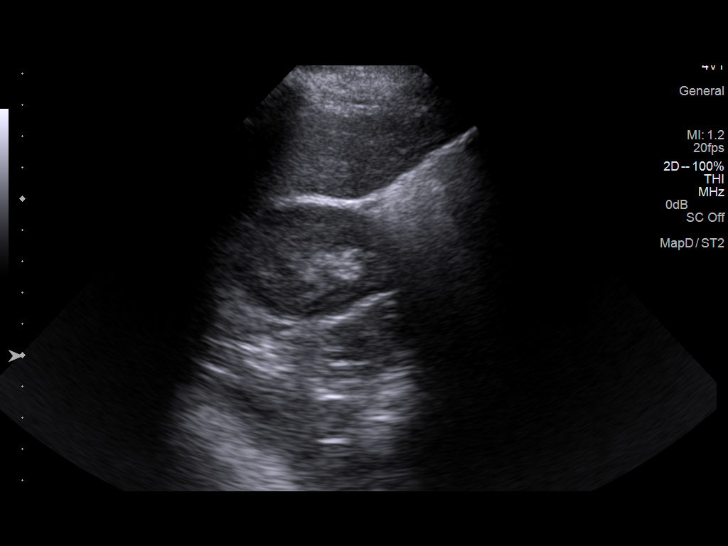
[im 8/20]
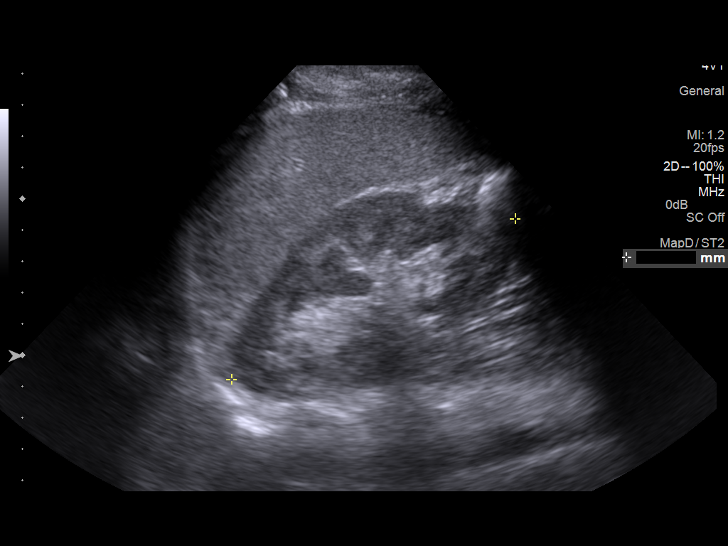
[im 10/20]
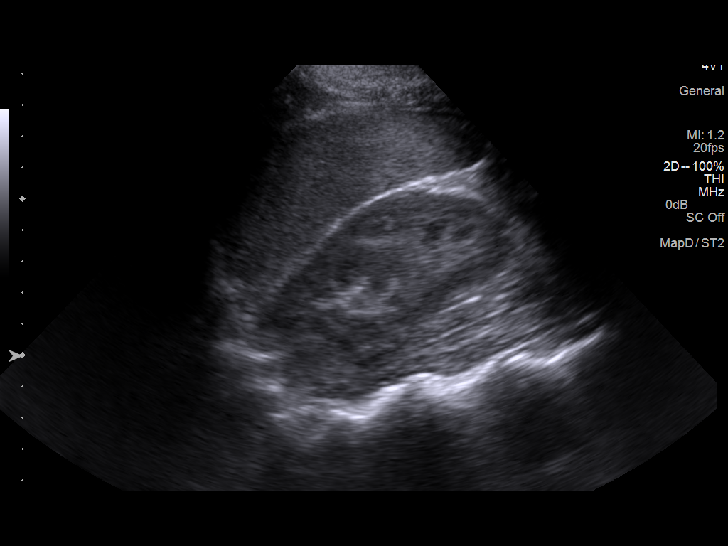
[im 11/20]
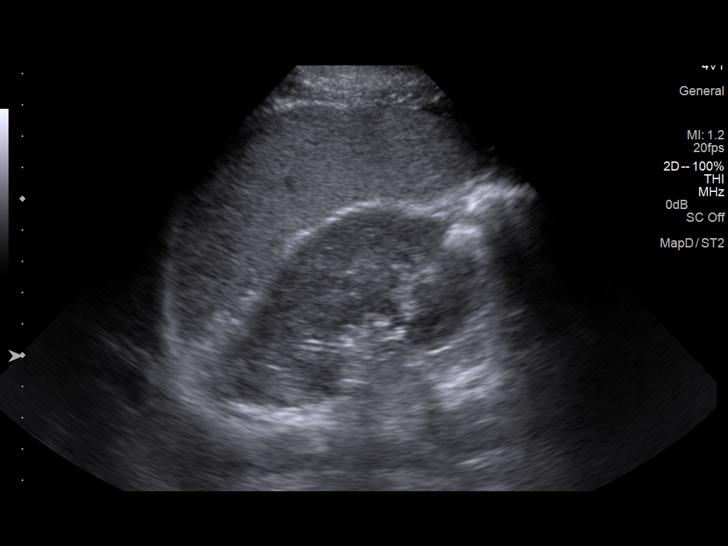
[im 13/20]
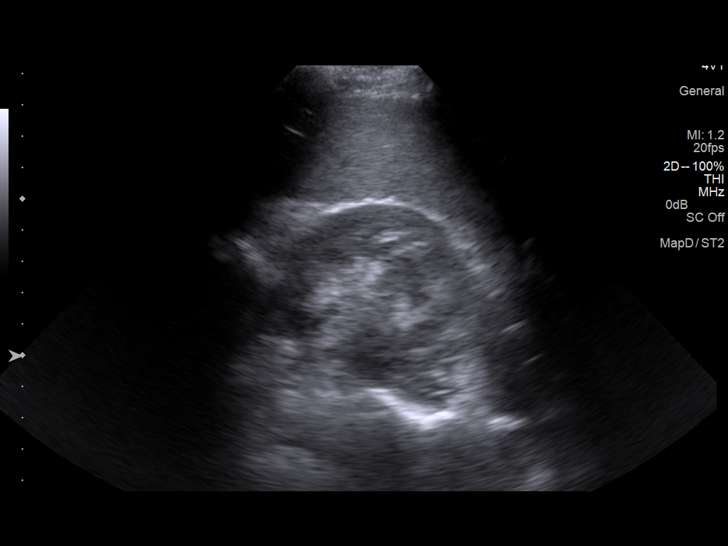
[im 14/20]
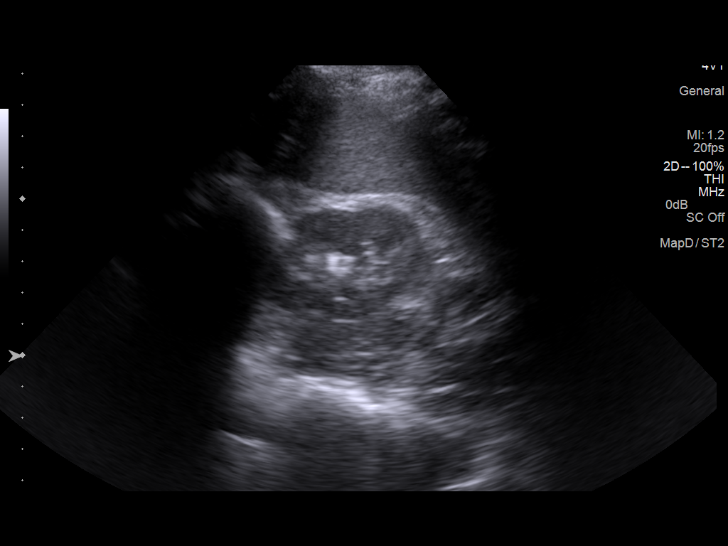
[im 16/20]
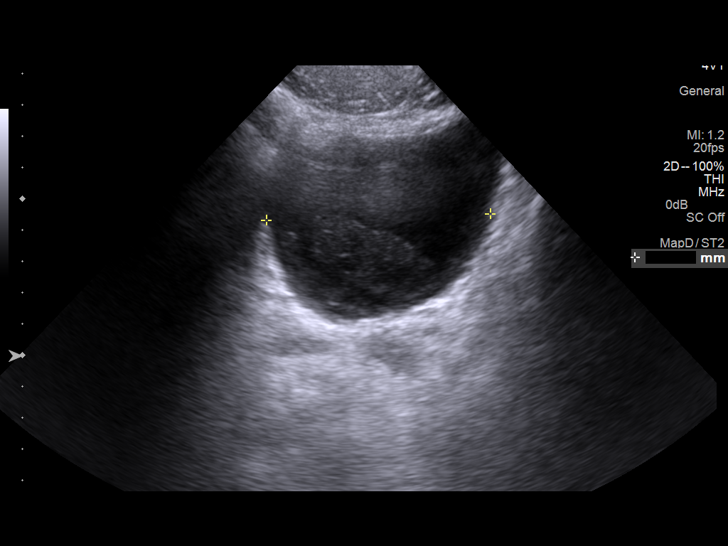
[im 17/20]
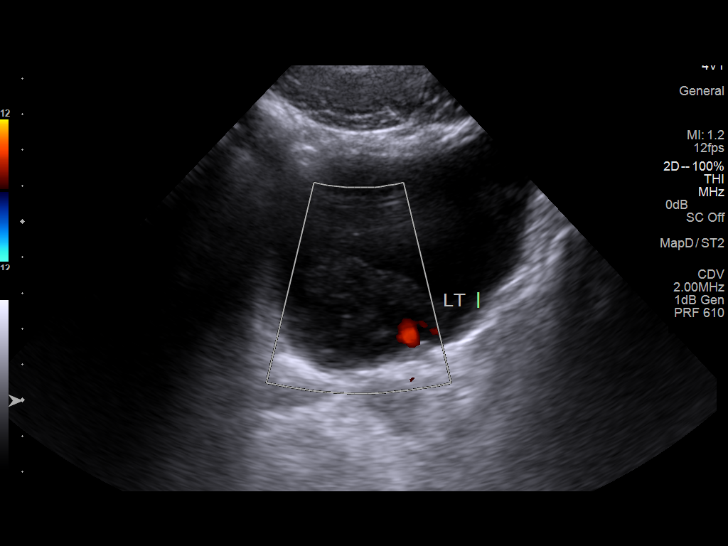
[im 18/20]
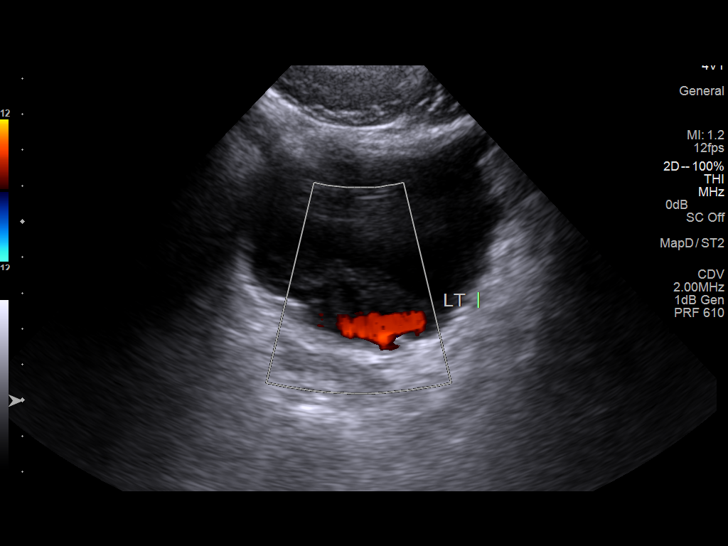
[im 20/20]
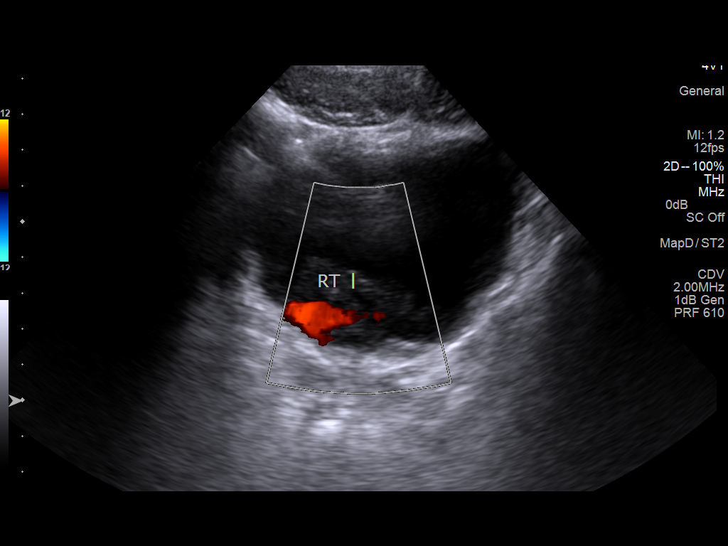

[14 of 20 positions shown; findings below may reference images not displayed]

FINDINGS: Right Kidney

Length: 10.5 cm. Echogenicity within normal limits. No mass or
hydronephrosis visualized.

Left Kidney

Length: 10.5cm. Echogenicity within normal limits. No mass or
hydronephrosis visualized.

Bladder

There is a moderate amount of debris dependently within the bladder.
The bladder is not particularly distended to suggest secondary
obstruction. Bilateral ureteral jets are noted. No definitive wall
thickening.
IMPRESSION: Debris/blood clot within the urinary bladder. No definitive source
seen. No urinary obstruction.
